# Patient Record
Sex: Female | Born: 1995 | Race: White | Hispanic: Yes | Marital: Single | Smoking: Never smoker
Health system: Southern US, Community
[De-identification: ages and names within clinical notes are randomized; demographics above are authoritative.]

## PROBLEM LIST (undated history)

## (undated) DIAGNOSIS — R51 Headache: Secondary | ICD-10-CM

## (undated) HISTORY — PX: BREAST SURGERY: SHX581

## (undated) HISTORY — DX: Headache: R51

---
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
## 2019-04-30 ENCOUNTER — Other Ambulatory Visit: Payer: Self-pay

## 2019-04-30 DIAGNOSIS — Z20822 Contact with and (suspected) exposure to covid-19: Secondary | ICD-10-CM

## 2019-05-01 LAB — NOVEL CORONAVIRUS, NAA: SARS-CoV-2, NAA: NOT DETECTED

## 2022-02-28 ENCOUNTER — Telehealth: Payer: Self-pay | Admitting: Physician Assistant

## 2022-02-28 ENCOUNTER — Other Ambulatory Visit (HOSPITAL_COMMUNITY): Payer: Self-pay

## 2022-02-28 ENCOUNTER — Other Ambulatory Visit: Payer: Self-pay

## 2022-02-28 DIAGNOSIS — B001 Herpesviral vesicular dermatitis: Secondary | ICD-10-CM

## 2022-02-28 MED ORDER — VALACYCLOVIR HCL 1 G PO TABS
2000.0000 mg | ORAL_TABLET | Freq: Two times a day (BID) | ORAL | 0 refills | Status: AC
  Filled 2022-02-28 (×2): qty 4, 1d supply, fill #0

## 2022-02-28 NOTE — Progress Notes (Signed)
I have spent 5 minutes in review of e-visit questionnaire, review and updating patient chart, medical decision making and response to patient.   Trexton Escamilla Cody Reza Crymes, PA-C    

## 2022-02-28 NOTE — Progress Notes (Signed)

## 2022-02-28 NOTE — Progress Notes (Signed)
Message sent to patient requesting further input regarding current symptoms. Awaiting patient response.  

## 2022-03-18 ENCOUNTER — Other Ambulatory Visit: Payer: Self-pay

## 2022-03-18 MED ORDER — INFLUENZA VAC SPLIT QUAD 0.5 ML IM SUSY
0.5000 mL | PREFILLED_SYRINGE | INTRAMUSCULAR | 0 refills | Status: AC
  Filled 2022-03-18: qty 0.5, 1d supply, fill #0
